# Patient Record
Sex: Male | Born: 2000 | Race: Black or African American | Hispanic: No | Marital: Single | Smoking: Never smoker
Health system: Southern US, Community
[De-identification: ages and names within clinical notes are randomized; demographics above are authoritative.]

## PROBLEM LIST (undated history)

## (undated) DIAGNOSIS — M25569 Pain in unspecified knee: Secondary | ICD-10-CM

## (undated) DIAGNOSIS — M25562 Pain in left knee: Secondary | ICD-10-CM

## (undated) DIAGNOSIS — M79671 Pain in right foot: Secondary | ICD-10-CM

---
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
## 2018-05-16 ENCOUNTER — Encounter (HOSPITAL_COMMUNITY): Payer: Self-pay

## 2018-05-16 ENCOUNTER — Other Ambulatory Visit: Payer: Self-pay

## 2018-05-16 ENCOUNTER — Emergency Department (HOSPITAL_COMMUNITY): Payer: No Typology Code available for payment source

## 2018-05-16 ENCOUNTER — Emergency Department (HOSPITAL_COMMUNITY)
Admission: EM | Admit: 2018-05-16 | Discharge: 2018-05-16 | Disposition: A | Discharge status: Home / Self Care | Payer: No Typology Code available for payment source | Attending: Emergency Medicine | Admitting: Emergency Medicine

## 2018-05-16 DIAGNOSIS — Y929 Unspecified place or not applicable: Secondary | ICD-10-CM | POA: Diagnosis not present

## 2018-05-16 DIAGNOSIS — M25561 Pain in right knee: Secondary | ICD-10-CM

## 2018-05-16 DIAGNOSIS — Y999 Unspecified external cause status: Secondary | ICD-10-CM | POA: Diagnosis not present

## 2018-05-16 DIAGNOSIS — Y939 Activity, unspecified: Secondary | ICD-10-CM | POA: Insufficient documentation
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
## 2018-05-16 NOTE — Discharge Instructions (Addendum)
X-ray of the right knee was normal.  I have attached the results to this paperwork.  As we discussed please take ibuprofen 800 mg every 6 hours as needed for pain.  Ice the knee twice a day for 15 minutes at a time for the next 3 days.  Elevate the knee when possible.  Return to the ER if you have any new or concerning symptoms.
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
## 2019-06-03 ENCOUNTER — Ambulatory Visit (HOSPITAL_COMMUNITY)
Admission: EM | Admit: 2019-06-03 | Discharge: 2019-06-03 | Disposition: A | Discharge status: Home / Self Care | Payer: 59 | Attending: Family Medicine | Admitting: Family Medicine

## 2019-06-03 ENCOUNTER — Other Ambulatory Visit: Payer: Self-pay

## 2019-06-03 ENCOUNTER — Encounter (HOSPITAL_COMMUNITY): Payer: Self-pay | Admitting: Emergency Medicine

## 2019-06-03 DIAGNOSIS — J020 Streptococcal pharyngitis: Secondary | ICD-10-CM

## 2019-06-03 LAB — POCT RAPID STREP A: Streptococcus, Group A Screen (Direct): POSITIVE — AB

## 2019-06-03 MED ORDER — AMOXICILLIN 500 MG PO CAPS: 500.0000 mg | ORAL_CAPSULE | Freq: Three times a day (TID) | ORAL | 0 refills | Status: AC

## 2019-06-03 NOTE — ED Provider Notes (Signed)
[facility]    CSN: [id number] Arrival date & time: 06/03/19  4401      History   Chief Complaint Chief Complaint  Patient presents with  . Sore Throat  . Fatigue    HPI Kevin Barr is a 18 y.o. male.   The history is provided by the patient. No language interpreter was used.  Sore Throat This is a new problem. The current episode started 2 days ago. The problem occurs constantly. The problem has been gradually worsening. Nothing aggravates the symptoms. Nothing relieves the symptoms. He has tried nothing for the symptoms. The treatment provided no relief.  Pt complains of a sore throat.    History reviewed. No pertinent past medical history.  There are no active problems to display for this patient.   History reviewed. No pertinent surgical history.     Home Medications    Prior to Admission medications   Medication Sig Start Date End Date Taking? Authorizing Provider  amoxicillin (AMOXIL) 500 MG capsule Take 1 capsule (500 mg total) by mouth 3 (three) times daily. 06/03/19   Fransico Meadow, PA-C    Family History Family History  Problem Relation Age of Onset  . Healthy Mother   . Healthy Father     Social History Social History   Tobacco Use  . Smoking status: Never Smoker  . Smokeless tobacco: Never Used  Substance Use Topics  . Alcohol use: Never    Frequency: Never  . Drug use: Never     Allergies   Patient has no known allergies.   Review of Systems Review of Systems  All other systems reviewed and are negative.    Physical Exam Triage Vital Signs ED Triage Vitals  Enc Vitals Group     BP 06/03/19 1042 133/76     Pulse Rate 06/03/19 1042 75     Resp 06/03/19 1042 18     Temp 06/03/19 1042 99.1 F (37.3 C)     Temp Source 06/03/19 1042 Oral     SpO2 06/03/19 1042 100 %     Weight --      Height --      Head Circumference --      Peak Flow --      Pain Score 06/03/19 1043 5     Pain Loc --      Pain Edu? --       Excl. in [place]? --    No data found.  Updated Vital Signs BP 133/76 (BP Location: Left Arm)   Pulse 75   Temp 99.1 F (37.3 C) (Oral)   Resp 18   SpO2 100%   Visual Acuity Right Eye Distance:   Left Eye Distance:   Bilateral Distance:    Right Eye Near:   Left Eye Near:    Bilateral Near:     Physical Exam Vitals signs and nursing note reviewed.  Constitutional:      Appearance: He is well-developed.  HENT:     Head: Normocephalic.     Right Ear: Tympanic membrane normal.     Left Ear: Tympanic membrane normal.     Mouth/Throat:     Mouth: Mucous membranes are moist.     Pharynx: Pharyngeal swelling and posterior oropharyngeal erythema present.     Tonsils: 2+ on the right. 2+ on the left.  Eyes:     Conjunctiva/sclera: Conjunctivae normal.  Neck:     Musculoskeletal: Normal range of motion.  Cardiovascular:  Heart sounds: Normal heart sounds.  Pulmonary:     Effort: Pulmonary effort is normal.  Abdominal:     General: There is no distension.  Musculoskeletal: Normal range of motion.  Skin:    General: Skin is warm.  Neurological:     Mental Status: He is alert and oriented to person, place, and time.      UC Treatments / Results  Labs (all labs ordered are listed, but only abnormal results are displayed) Labs Reviewed  POCT RAPID STREP A - Abnormal; Notable for the following components:      Result Value   Streptococcus, Group A Screen (Direct) POSITIVE (*)    All other components within normal limits    EKG None  Radiology No results found.  Procedures Procedures (including critical care time)  Medications Ordered in UC Medications - No data to display  Initial Impression / Assessment and Plan / UC Course  I have reviewed the triage vital signs and the nursing notes.  Pertinent labs & imaging results that were available during my care of the patient were reviewed by me and considered in my medical decision making (see chart for  details).     Strep positive.  Pt given rx for amoxicillian  Final Clinical Impressions(s) / UC Diagnoses   Final diagnoses:  Streptococcal sore throat     Discharge Instructions     Return if any problems.   ED Prescriptions    Medication Sig Dispense Auth. Provider   amoxicillin (AMOXIL) 500 MG capsule Take 1 capsule (500 mg total) by mouth 3 (three) times daily. 21 capsule Elson AreasSofia, Jojo Pehl K, [place]-C     Controlled Substance Prescriptions [place] Controlled Substance Registry consulted? Not Applicable  An After Visit Summary was printed and given to the patient.    Elson AreasSofia, Carli Lefevers K, [place]-C 06/03/19 1141

## 2019-06-03 NOTE — ED Triage Notes (Signed)
Pt here for sore throat and fatigue x 1 week; denies obvious fever

## 2019-06-03 NOTE — Discharge Instructions (Signed)
Return if any problems.

## 2019-07-25 ENCOUNTER — Other Ambulatory Visit: Payer: Self-pay

## 2019-07-25 DIAGNOSIS — Z20822 Contact with and (suspected) exposure to covid-19: Secondary | ICD-10-CM

## 2019-07-26 LAB — NOVEL CORONAVIRUS, NAA: SARS-CoV-2, NAA: NOT DETECTED

## 2019-08-12 ENCOUNTER — Other Ambulatory Visit: Payer: Self-pay

## 2019-08-12 DIAGNOSIS — Z20822 Contact with and (suspected) exposure to covid-19: Secondary | ICD-10-CM

## 2019-08-13 LAB — NOVEL CORONAVIRUS, NAA: SARS-CoV-2, NAA: NOT DETECTED
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
## 2020-05-02 ENCOUNTER — Ambulatory Visit: Payer: Self-pay | Attending: Internal Medicine

## 2020-05-02 DIAGNOSIS — Z23 Encounter for immunization: Secondary | ICD-10-CM

## 2020-05-02 NOTE — Progress Notes (Signed)
   Covid-19 Vaccination Clinic  Name:  Kevin Barr    MRN: [id number] DOB: 02-Dec-2001  05/02/2020  [name] was observed post Covid-19 immunization for 15 minutes without incident. He was provided with Vaccine Information Sheet and instruction to access the V-Safe system.   [name] was instructed to call 911 with any severe reactions post vaccine: Marland Kitchen Difficulty breathing  . Swelling of face and throat  . A fast heartbeat  . A bad rash all over body  . Dizziness and weakness   Immunizations Administered    Name Date Dose VIS Date Route   Pfizer COVID-19 Vaccine 05/02/2020 11:20 AM 0.3 mL 02/12/2019 Intramuscular   Manufacturer: [employer], [employer]   Lot: [id number]   NDC: [address]-6

## 2020-05-25 ENCOUNTER — Ambulatory Visit: Payer: No Typology Code available for payment source | Attending: Internal Medicine

## 2022-01-28 ENCOUNTER — Encounter

## 2022-01-28 ENCOUNTER — Ambulatory Visit: Payer: PRIVATE HEALTH INSURANCE

## 2022-01-28 ENCOUNTER — Inpatient Hospital Stay: Admit: 2022-01-28 | Payer: PRIVATE HEALTH INSURANCE | Attending: Orthopaedic Surgery

## 2022-01-28 DIAGNOSIS — M79671 Pain in right foot: Secondary | ICD-10-CM

## 2022-01-31 NOTE — Progress Notes (Signed)
Formatting of this note is different from the original.  Subjective:   Chief Complaint: Pain of the Right Ankle    Patient ID: Donald Monroe is a 21 y.o. male who is being seen for follow-up of his RIGHT foot.    Prior history:   He complains of right foot pain.  Pain is localized at dorsal midfoot with minimal radiating and described as achy.  The pain has been present since 01/26/2022.      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  He was able to finish the game but had a significant limp and was swelling.  He is here today with his athletic trainer he has been weight-bearing in a Cam boot.    Today he states that he is getting better.  He walks in regular shoes today with flat plate in place    Pain is 3 /10    History of DVT:  Denies  On anticoagulation:  Denies    Prior Treatment with:  As above    FH: neg DVT, hereditary clotting disorders     Past Medical History:   Diagnosis Date    no relevant past medical history      Past Surgical History:   Procedure Laterality Date    KNEE SURGERY      NO RELEVANT SURGERIES       Social History     Socioeconomic History    Marital status: Single     Spouse name: Not on file    Number of children: Not on file    Years of education: Not on file    Highest education level: Not on file   Occupational History    Not on file   Tobacco Use    Smoking status: Never    Smokeless tobacco: Never   Substance and Sexual Activity    Alcohol use: Never    Drug use: Never    Sexual activity: Not on file   Other Topics Concern    Not on file   Social History Narrative    Not on file     Social Determinants of Health     Financial Resource Strain: Not on file   Food Insecurity: Not on file   Transportation Needs: Not on file   Physical Activity: Not on file    Stress: Not on file   Social Connections: Not on file   Intimate Partner Violence: Not on file   Housing Stability: Not on file     No Known Allergies    ROS: General: no constitutional symptoms   HEENT:  Head atraumatic normocephalic   Endocrine:  DM per HPI and PMH   Cardiovascular:  Denies chest pain   Respiratory:  Denies shortness of breath   Neurologic:  Denies numbness or tingling   Musculoskeletal:  As above   Lymphatic:  Denies extremity swelling other than stated in HPI        Objective:   Constitutional:  No acute distress.   Respiratory:  No labored breathing.  Cardiovascular:  No chest pain  Psychiatric: Alert and cooperative   Eyes:sclera are non-icteric  Musculoskeletal     PE:   NAD   Heart: no chest pain  Lungs: nonlabored respirations   RIGHT Lower extremity   Gait:  Antalgic  Tender to palpation:  Mild tenderness over the 2nd metatarsal base.  No pain with transverse tarsal motion.  blisters no gross deformities.  No plantar ecchymosis    Range of Motion : 15 degrees dorsiflexion,40 degrees plantarflexion  Subtalar motion:  Full  Muscle Strength: 5/5 DF/PF/Inv/Eversion in the sitting position    Sensation: Intact to light touch throughout sural, saphenous, superficial peroneal, deep peroneal and medial/lateral plantar nerve distributions.    Semmes-Weinstein 5.07 filament (10g) testing deferred.  Vascular:  Warm well-perfused toes with brisk capillary refill  Special Tests:  .  He is able to actively extend his great toe with 5/5 strength    He is able to walk down the hallway on his tip toes and on his heels.  He is able to perform a single leg heel rise without pain  Procedures:    Procedures    Radiographs:    Right foot MRI 01/28/2022      FINDINGS: Bone marrow: within normal limits. No fracture, dislocation, or marrow  replacing process. No evidence of stress reaction or periostitis.    Joint fluid:   None.    Tendons: Intact.    Muscles: Within normal limits.    Neurovascular bundles: Within normal limits.    Articular cartilage:intact. No osteochondral lesion.    Soft tissue mass: No mass. Mild subcutaneous edema is seen dorsal to the foot.  The Lisfranc ligament is intact.    IMPRESSION:  Nonspecific mild subcutaneous edema dorsal to the foot. Otherwise intact.      I independently reviewed and interpreted  x-rays of the right foot taken on 01/27/2022 that show no obvious evidence of acute osseous abnormalities, the medial border the 2nd metatarsal is in line with the medial border the middle cuneiform and on the lateral view there is no dorsal subluxation of the TMT joints.    I independently reviewed and interpreted x-rays of the right ankle taken at this clinic on 01/27/2022 that show well-maintained ankle mortise with no  obvious evidence of acute osseous abnormality.    Assessment:     1. Contusion of right foot, subsequent encounter          Plan:     The patient verbalized understanding of exam findings and treatment plan. We engaged in the shared decision-making process and treatment options were discussed at length with the patient. Surgical and conservative management discussed today along with risks and benefits.    He has a right foot contusion and overall is getting better.  I reviewed his MRI independently today and reviewed the results with his trainer.  There is no evidence of acute osseous ligament or tendon injury.  He can advance his activities to tolerance.  I will see him back in 1 week    I discussed management options with the patient.  All questions were answered to the best of my ability and to the patient?s satisfaction.  I discussed their history symptoms physical exam findings diagnostic testing results diagnosis and treatment options. The patient verbalized understanding and elected to proceed with conservative treatment    XRAYS at f/u:  None    No orders of the defined types were  placed in this encounter.    Return in about 1 week (around 02/07/2022).     Supervising Physician: Darnelle Spangle  This  note has been transcribed electronically using voice recognition software.  It is believed to be accurate but may contain errors due to technological limitations.  Electronically signed by Darnelle Spangle, MD at 01/31/2022  1:49 PM EST

## 2022-12-28 ENCOUNTER — Encounter

## 2022-12-28 ENCOUNTER — Inpatient Hospital Stay: Admit: 2022-12-28 | Payer: PRIVATE HEALTH INSURANCE

## 2022-12-28 DIAGNOSIS — M25569 Pain in unspecified knee: Secondary | ICD-10-CM

## 2022-12-28 DIAGNOSIS — M25562 Pain in left knee: Secondary | ICD-10-CM
